# Patient Record
Sex: Female | Born: 2005 | Race: White | Hispanic: No | Smoking: Never smoker
Health system: Southern US, Community
[De-identification: ages and names within clinical notes are randomized; demographics above are authoritative.]

## PROBLEM LIST (undated history)

## (undated) DIAGNOSIS — J302 Other seasonal allergic rhinitis: Secondary | ICD-10-CM

## (undated) DIAGNOSIS — J189 Pneumonia, unspecified organism: Secondary | ICD-10-CM

## (undated) DIAGNOSIS — J45909 Unspecified asthma, uncomplicated: Secondary | ICD-10-CM

## (undated) DIAGNOSIS — R519 Headache, unspecified: Secondary | ICD-10-CM

## (undated) HISTORY — DX: Pneumonia, unspecified organism: J18.9

## (undated) HISTORY — DX: Unspecified asthma, uncomplicated: J45.909

## (undated) HISTORY — PX: TONSILLECTOMY: SUR1361

## (undated) HISTORY — DX: Headache, unspecified: R51.9

---
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
## 2023-04-08 ENCOUNTER — Ambulatory Visit (INDEPENDENT_AMBULATORY_CARE_PROVIDER_SITE_OTHER): Payer: Medicaid Other | Admitting: Obstetrics and Gynecology

## 2023-04-08 ENCOUNTER — Other Ambulatory Visit (HOSPITAL_COMMUNITY)
Admission: RE | Admit: 2023-04-08 | Discharge: 2023-04-08 | Disposition: A | Discharge status: Home / Self Care | Payer: Medicaid Other | Source: Ambulatory Visit | Attending: Obstetrics and Gynecology | Admitting: Obstetrics and Gynecology

## 2023-04-08 ENCOUNTER — Encounter: Payer: Self-pay | Admitting: Obstetrics and Gynecology

## 2023-04-08 VITALS — BP 125/88 | HR 75 | Ht 61.0 in | Wt 101.0 lb

## 2023-04-08 DIAGNOSIS — Z3202 Encounter for pregnancy test, result negative: Secondary | ICD-10-CM

## 2023-04-08 DIAGNOSIS — Z30017 Encounter for initial prescription of implantable subdermal contraceptive: Secondary | ICD-10-CM | POA: Diagnosis not present

## 2023-04-08 DIAGNOSIS — Z01419 Encounter for gynecological examination (general) (routine) without abnormal findings: Secondary | ICD-10-CM | POA: Diagnosis not present

## 2023-04-08 DIAGNOSIS — Z113 Encounter for screening for infections with a predominantly sexual mode of transmission: Secondary | ICD-10-CM

## 2023-04-08 DIAGNOSIS — Z3009 Encounter for other general counseling and advice on contraception: Secondary | ICD-10-CM

## 2023-04-08 DIAGNOSIS — Z1339 Encounter for screening examination for other mental health and behavioral disorders: Secondary | ICD-10-CM

## 2023-04-08 LAB — POCT URINE PREGNANCY: Preg Test, Ur: NEGATIVE

## 2023-04-08 MED ORDER — ETONOGESTREL 68 MG ~~LOC~~ IMPL
68.0000 mg | DRUG_IMPLANT | Freq: Once | SUBCUTANEOUS | Status: AC
  Administered 2023-04-08: 68 mg via SUBCUTANEOUS

## 2023-04-08 NOTE — Progress Notes (Addendum)
17 y.o. New GYN presents for AEX/STD Screening and BC Consult.  Pt currently takes OCP but wants to switch to a non hormonal BC.  UPT today is Negative.  Administrations This Visit     etonogestrel (NEXPLANON) implant 68 mg     Admin Date 04/08/2023 Action Given Dose 68 mg Route Subdermal Documented By Maretta Bees, RMA

## 2023-04-08 NOTE — Progress Notes (Signed)
   GYNECOLOGY OFFICE PROCEDURE NOTE  Tina Dunn is a 17 y.o. G0P0000 here for Nexplanon insertion.   Nexplanon Insertion Procedure Patient identified, informed consent performed, consent signed.   Patient does understand that irregular bleeding is a very common side effect of this medication. She was advised to have backup contraception for one week after placement. Pregnancy test in clinic today was negative.  Appropriate time out taken.    Patient's left arm was prepped and draped in the usual sterile fashion. The insertion area was marked.  Patient was prepped with alcohol swab and then injected with 3 ml of 1% lidocaine.  She was prepped with betadine, Nexplanon removed from packaging,  Device confirmed in needle, then inserted full length of needle and withdrawn per handbook instructions. Nexplanon was able to palpated in the patient's arm; patient palpated the insert herself. There was minimal blood loss.  Patient insertion site covered with guaze and a pressure bandage to reduce any bruising.    The patient tolerated the procedure well and was given post procedure instructions.   Harvie Bridge, MD Obstetrician & Gynecologist, [facility] for [employer], [facility]

## 2023-04-08 NOTE — Progress Notes (Signed)
ANNUAL EXAM Patient name: Tina Dunn MRN [id number]  Date of birth: [birth year] Chief Complaint:   New Patient (Initial Visit)  History of Present Illness:   Tina Dunn is a 17 y.o. G0P0000 with Patient's last menstrual period was 03/30/2023 (exact date). being seen today for a routine annual exam.  Current complaints:   Would like to discuss birth control options. Has regular but painful periods. Has missed school due to period pain in the past. Is on OCPs but would like something that she doesn't have to take every day.  Has maternal grandmother with breast cancer   Upstream - 04/08/23 0924       Pregnancy Intention Screening   Does the patient want to become pregnant in the next year? No    Does the patient's partner want to become pregnant in the next year? No    Would the patient like to discuss contraceptive options today? Yes      Contraception Wrap Up   Current Method Oral Contraceptive    End Method Hormonal Implant    Contraception Counseling Provided Yes    How was the end contraceptive method provided? Provided on site            The pregnancy intention screening data noted above was reviewed. Potential methods of contraception were discussed. The patient elected to proceed with Hormonal Implant.   Last pap n/a Last mammogram: n/a - maternal grandmother with breast cancer Last colonoscopy: n/a - no FHx HPV vaccine: yes     04/08/2023    9:25 AM  Depression screen PHQ 2/9  Decreased Interest 0  Down, Depressed, Hopeless 0  PHQ - 2 Score 0  Altered sleeping 0  Tired, decreased energy 0  Change in appetite 0  Feeling bad or failure about yourself  0  Trouble concentrating 0  Moving slowly or fidgety/restless 0  Suicidal thoughts 0  PHQ-9 Score 0  Difficult doing work/chores Not difficult at all        04/08/2023    9:25 AM  GAD 7 : Generalized Anxiety Score  Nervous, Anxious, on Edge 1  Control/stop worrying 1  Worry  too much - different things 0  Trouble relaxing 0  Restless 1  Easily annoyed or irritable 3  Afraid - awful might happen 0  Total GAD 7 Score 6  Anxiety Difficulty Not difficult at all     Review of Systems:   Pertinent items are noted in HPI Denies any headaches, blurred vision, fatigue, shortness of breath, chest pain, abdominal pain, abnormal vaginal discharge/itching/odor/irritation, problems with periods, bowel movements, urination, or intercourse unless otherwise stated above. Pertinent History Reviewed:  Reviewed past medical,surgical, social and family history.  Reviewed problem list, medications and allergies. Physical Assessment:   Vitals:   04/08/23 0918  BP: 125/88  Pulse: 75  Weight: 101 lb (45.8 kg)  Height: 5\' 1"  (1.549 m)  Body mass index is 19.08 kg/m.        Physical Examination:   General appearance - well appearing, and in no distress  Mental status - alert, oriented to person, place, and time  Chest - respiratory effort normal  Heart - normal peripheral perfusion  Breasts - deferred  Abdomen - soft, nontender, nondistended, no masses or organomegaly  Pelvic - deferred  Results for orders placed or performed in visit on 04/08/23 (from the past 24 hour(s))  POCT urine pregnancy   Collection Time: 04/08/23 10:30 AM  Result Value Ref Range   Preg  Test, Ur Negative Negative    Assessment & Plan:  1) Well-Woman Exam Mammogram: @ 19yo, or sooner if problems Colonoscopy: @ 19yo, or sooner if problems Pap: at 19yo Gardasil: completed GC/CT: collected (self swab) HIV/HCV: ordered  2) Encounter for Nexplanon insertion Uncomplicated insertion See procedure note  Labs/procedures today:   Orders Placed This Encounter  Procedures   Hepatitis B Surface AntiGEN   Hepatitis C Antibody   RPR   HIV antibody (with reflex)   POCT urine pregnancy   Meds:  Meds ordered this encounter  Medications   etonogestrel (NEXPLANON) implant 68 mg   Follow-up:  Return in about 1 year (around 04/07/2024) for annual exam.  Lennart Pall, MD 04/08/2023 11:36 AM

## 2023-04-11 LAB — CERVICOVAGINAL ANCILLARY ONLY
Chlamydia: NEGATIVE
Comment: NEGATIVE
Comment: NEGATIVE
Comment: NORMAL
Neisseria Gonorrhea: NEGATIVE
Trichomonas: NEGATIVE

## 2023-07-28 ENCOUNTER — Ambulatory Visit: Payer: Medicaid Other | Admitting: Family Medicine

## 2023-08-25 ENCOUNTER — Ambulatory Visit: Payer: Medicaid Other | Admitting: Obstetrics and Gynecology

## 2023-08-25 ENCOUNTER — Encounter: Payer: Self-pay | Admitting: Obstetrics and Gynecology

## 2023-08-25 VITALS — BP 113/74 | HR 79 | Ht 61.0 in | Wt 102.0 lb

## 2023-08-25 DIAGNOSIS — Z3009 Encounter for other general counseling and advice on contraception: Secondary | ICD-10-CM

## 2023-08-25 DIAGNOSIS — R519 Headache, unspecified: Secondary | ICD-10-CM

## 2023-08-25 DIAGNOSIS — R110 Nausea: Secondary | ICD-10-CM

## 2023-08-25 DIAGNOSIS — Z3046 Encounter for surveillance of implantable subdermal contraceptive: Secondary | ICD-10-CM | POA: Diagnosis not present

## 2023-08-25 MED ORDER — NORETHINDRONE 0.35 MG PO TABS: 1.0000 | ORAL_TABLET | Freq: Every day | ORAL | 3 refills | Status: DC

## 2023-08-25 NOTE — Progress Notes (Signed)
 18 y.o. GYN presents for Nexplanon removal, it was inserted last August.  Pt c/o headaches 5-8/10, cramping 6/10, nausea, loss of appetite.  Pt wants OCP for [facility]

## 2023-08-25 NOTE — Progress Notes (Signed)
   RETURN GYNECOLOGY VISIT  Subjective:  Tina Dunn is a 18 y.o. G0P0000 with Nexplanon  placed 04/08/23 presenting for side effects  Reports cramping 6/10, headaches, nausea, loss of appetite, cramping, and mood changes. This symptoms started 1 month after nexplanon  insertion. Can't think of any other contributing factors to her symptoms and would like Nexplanon  removed. Would like to resume oral contraceptive, was on incassia  in the past.   Objective:   Vitals:   08/25/23 1444  BP: 113/74  Pulse: 79  Weight: 102 lb (46.3 kg)  Height: 5' 1 (1.549 m)    General:  Alert, oriented and cooperative. Patient is in no acute distress.  Skin: Skin is warm and dry. No rash noted.   Cardiovascular: Normal heart rate noted  Respiratory: Normal respiratory effort, no problems with respiration noted   Assessment and Plan:  [name] is a 18 y.o. s/p uncomplicated Nexplanon  removal. Will resume POPs.   Encounter for removal of subdermal contraceptive implant [Z30.46] Encounter for general counseling and advice on contraceptive management Discussed OCPs and/or scheduled NSAIDs to help with irregular bleeding/side effects of Nexplanon , but pt declined. She would prefer to resume POPs.  Nexplanon  removed without issue, see separate procedure note Rx sent for POPs per patient preference -     norethindrone  (MICRONOR ) 0.35 MG tablet; Take 1 tablet (0.35 mg total) by mouth daily.  Kieth JAYSON Carolin, MD

## 2023-12-14 ENCOUNTER — Encounter: Payer: Self-pay | Admitting: Obstetrics and Gynecology

## 2024-08-30 ENCOUNTER — Ambulatory Visit: Admitting: Obstetrics and Gynecology

## 2024-08-30 ENCOUNTER — Encounter: Payer: Self-pay | Admitting: Obstetrics and Gynecology

## 2024-08-30 ENCOUNTER — Other Ambulatory Visit (HOSPITAL_COMMUNITY)
Admission: RE | Admit: 2024-08-30 | Discharge: 2024-08-30 | Disposition: A | Source: Ambulatory Visit | Attending: Obstetrics and Gynecology | Admitting: Obstetrics and Gynecology

## 2024-08-30 VITALS — BP 113/75 | HR 83 | Ht 61.0 in | Wt 116.0 lb

## 2024-08-30 DIAGNOSIS — Z23 Encounter for immunization: Secondary | ICD-10-CM

## 2024-08-30 DIAGNOSIS — Z113 Encounter for screening for infections with a predominantly sexual mode of transmission: Secondary | ICD-10-CM | POA: Insufficient documentation

## 2024-08-30 DIAGNOSIS — J45909 Unspecified asthma, uncomplicated: Secondary | ICD-10-CM | POA: Insufficient documentation

## 2024-08-30 DIAGNOSIS — Z01419 Encounter for gynecological examination (general) (routine) without abnormal findings: Secondary | ICD-10-CM

## 2024-08-30 MED ORDER — NORETHINDRONE 0.35 MG PO TABS: 1.0000 | ORAL_TABLET | Freq: Every day | ORAL | 3 refills | Status: AC

## 2024-08-30 NOTE — Progress Notes (Signed)
 "  ANNUAL EXAM Patient name: Tina Dunn MRN [id number]  Date of birth: [birth year] Chief Complaint:   Gynecologic Exam  History of Present Illness:   Tina Dunn is a 19 y.o. G0P0000 with Patient's last menstrual period was 08/08/2024 (exact date). being seen today for a routine annual exam.  Current complaints: Would like to get care gaps addressed from mychart Is due for several vaccines but we only have tdap/flu in office. She'd like to get these done today  The pregnancy intention screening data noted above was reviewed. Potential methods of contraception were discussed. The patient elected to proceed with Oral Contraceptive.   Last pap n/a Last mammogram: n/a - maternal grandmother with breast cancer Last colonoscopy: n/a - no FHx HPV vaccine: yes     08/30/2024    4:29 PM 04/08/2023    9:25 AM  Depression screen PHQ 2/9  Decreased Interest 0 0  Down, Depressed, Hopeless 0 0  PHQ - 2 Score 0 0  Altered sleeping 2 0  Tired, decreased energy 0 0  Change in appetite 0 0  Feeling bad or failure about yourself  0 0  Trouble concentrating 0 0  Moving slowly or fidgety/restless 0 0  Suicidal thoughts 0 0  PHQ-9 Score 2 0   Difficult doing work/chores  Not difficult at all     Data saved with a previous flowsheet row definition        08/30/2024    4:30 PM 04/08/2023    9:25 AM  GAD 7 : Generalized Anxiety Score  Nervous, Anxious, on Edge 0 1  Control/stop worrying 0 1  Worry too much - different things 0 0  Trouble relaxing 0 0  Restless 0 1  Easily annoyed or irritable 0 3  Afraid - awful might happen 0 0  Total GAD 7 Score 0 6  Anxiety Difficulty  Not difficult at all   Review of Systems:   Pertinent items are noted in HPI Denies any headaches, blurred vision, fatigue, shortness of breath, chest pain, abdominal pain, abnormal vaginal discharge/itching/odor/irritation, problems with periods, bowel movements, urination, or intercourse unless  otherwise stated above. Pertinent History Reviewed:  Reviewed past medical,surgical, social and family history.  Reviewed problem list, medications and allergies. Physical Assessment:   Vitals:   08/30/24 1538  BP: 113/75  Pulse: 83  Weight: 116 lb (52.6 kg)  Height: 5' 1 (1.549 m)  Body mass index is 21.92 kg/m.        Physical Examination:   General appearance - well appearing, and in no distress  Mental status - alert, oriented to person, place, and time  Chest - respiratory effort normal  Heart - normal peripheral perfusion  Breasts - deferred  Pelvic - offered, deferred as no pelvic complaints today  Chaperone present for exam  No results found for this or any previous visit (from the past 24 hours).  Assessment & Plan:  1) Well-Woman Exam Mammogram: @ 21yo, or sooner if problems Colonoscopy: @ 21yo, or sooner if problems Pap: Due at age 28 Gardasil: Completed GC/CT: Collected HIV/HCV: Ordered OCP refilled Tdap & flu shot today  Labs/procedures today:   Orders Placed This Encounter  Procedures   Tdap vaccine greater than or equal to 7yo IM   Flu Vaccine QUAD 62mo+IM (Fluarix, Fluzone & Alfiuria Quad PF)   HIV antibody (with reflex)   Hepatitis C Antibody   Hepatitis B Surface AntiGEN   RPR   Meds:  Meds ordered this encounter  Medications   norethindrone  (MICRONOR ) 0.35 MG tablet    Sig: Take 1 tablet (0.35 mg total) by mouth daily.    Dispense:  90 tablet    Refill:  3   Follow-up: Return in about 6 months (around 02/27/2025) for pap, or in 1 year for annual with pap.  Kieth JAYSON Carolin, MD 08/30/2024 5:22 PM  "

## 2024-08-31 LAB — CERVICOVAGINAL ANCILLARY ONLY
Chlamydia: NEGATIVE
Comment: NEGATIVE
Comment: NEGATIVE
Comment: NORMAL
Neisseria Gonorrhea: NEGATIVE
Trichomonas: NEGATIVE

## 2024-08-31 LAB — HIV ANTIBODY (ROUTINE TESTING W REFLEX): HIV Screen 4th Generation wRfx: NONREACTIVE

## 2024-08-31 LAB — HEPATITIS C ANTIBODY: Hep C Virus Ab: NONREACTIVE

## 2024-08-31 LAB — SYPHILIS: RPR W/REFLEX TO RPR TITER AND TREPONEMAL ANTIBODIES, TRADITIONAL SCREENING AND DIAGNOSIS ALGORITHM: RPR Ser Ql: NONREACTIVE

## 2024-08-31 LAB — HEPATITIS B SURFACE ANTIGEN: Hepatitis B Surface Ag: NEGATIVE

## 2024-09-03 ENCOUNTER — Ambulatory Visit: Payer: Self-pay | Admitting: Obstetrics and Gynecology
